# Patient Record
Sex: Male | Born: 1985 | Race: White | Hispanic: No | Marital: Single | Smoking: Current every day smoker
Health system: Southern US, Community
[De-identification: ages and names within clinical notes are randomized; demographics above are authoritative.]

---

## 2006-04-06 ENCOUNTER — Emergency Department: Payer: Self-pay | Admitting: Emergency Medicine

## 2008-07-22 ENCOUNTER — Emergency Department: Payer: Self-pay | Admitting: Emergency Medicine

## 2008-08-06 ENCOUNTER — Emergency Department: Payer: Self-pay | Admitting: Emergency Medicine

## 2008-08-28 ENCOUNTER — Emergency Department: Payer: Self-pay | Admitting: Emergency Medicine

## 2008-12-18 ENCOUNTER — Emergency Department: Payer: Self-pay | Admitting: Emergency Medicine

## 2009-06-22 ENCOUNTER — Emergency Department: Payer: Self-pay | Admitting: Emergency Medicine

## 2012-12-21 ENCOUNTER — Emergency Department: Payer: Self-pay | Admitting: Emergency Medicine

## 2012-12-21 LAB — CBC
HCT: 45.2 % (ref 40.0–52.0)
HGB: 15.7 g/dL (ref 13.0–18.0)
MCH: 30.8 pg (ref 26.0–34.0)
MCV: 89 fL (ref 80–100)
RBC: 5.11 10*6/uL (ref 4.40–5.90)
RDW: 13.8 % (ref 11.5–14.5)
WBC: 10.4 10*3/uL (ref 3.8–10.6)

## 2012-12-28 LAB — WOUND CULTURE

## 2017-08-09 ENCOUNTER — Encounter: Payer: Self-pay | Admitting: *Deleted

## 2017-08-09 ENCOUNTER — Emergency Department
Admission: EM | Admit: 2017-08-09 | Discharge: 2017-08-09 | Discharge status: Against Medical Advice | Payer: Self-pay | Attending: Emergency Medicine | Admitting: Emergency Medicine

## 2017-08-09 DIAGNOSIS — T50901A Poisoning by unspecified drugs, medicaments and biological substances, accidental (unintentional), initial encounter: Secondary | ICD-10-CM | POA: Insufficient documentation

## 2017-08-09 DIAGNOSIS — Z5320 Procedure and treatment not carried out because of patient's decision for unspecified reasons: Secondary | ICD-10-CM | POA: Insufficient documentation

## 2017-08-09 DIAGNOSIS — F439 Reaction to severe stress, unspecified: Secondary | ICD-10-CM

## 2017-08-09 DIAGNOSIS — F17200 Nicotine dependence, unspecified, uncomplicated: Secondary | ICD-10-CM | POA: Insufficient documentation

## 2017-08-09 DIAGNOSIS — F430 Acute stress reaction: Secondary | ICD-10-CM | POA: Insufficient documentation

## 2017-08-09 NOTE — ED Triage Notes (Signed)
[place] where bystanders noticed pt was altered and lethargic, EMS reports slowed resp rate upon their arrival, gave 0.52 mg Narcan PTA and pt became more responsive, upon arrival pt awake and alert, states "this is all over exaggerated" , pt refuses to state if he took medication, states "I just lost a 100,000 dollar job because of this, this is screwed up"

## 2017-08-09 NOTE — ED Provider Notes (Signed)
[facility] Emergency Department Provider Note  ____________________________________________  Time seen: Approximately 10:51 AM  I have reviewed the triage vital signs and the nursing notes.   HISTORY  Chief Complaint Drug Overdose    HPI Caleb Clark is a 31 y.o. male with a history of polysubstance abuse brought by EMS for accidental overdose. The patient admits to taking Xanax and heroin last night and then again this morning. EMS was called for decreased responsiveness; on their arrival, he had significant hypertension with blood pressure of 230s over 110s, normal blood sugar. During his transport, he began to become somnolent and was given 0.5 mg of Narcan with reversal of his somnolence. On arrival to the emergency department, the patient is hypertensive and tachycardic but afebrile. He is requesting to be discharged. The patient denies any pain "I'm fine." He has a GCS is 15, and he denies any SI, HI or hallucinations.   History reviewed. No pertinent past medical history.  There are no active problems to display for this patient.   History reviewed. No pertinent surgical history.    Allergies Patient has no known allergies.  History reviewed. No pertinent family history.  Social History Social History  Substance Use Topics  . Smoking status: Current Every Day Smoker  . Smokeless tobacco: Never Used  . Alcohol use No    Review of Systems Constitutional: No fever/chills. Positive decreased responsiveness. No known injury or trauma. Eyes: No visual changes. ENT: No sore throat. No congestion or rhinorrhea. Cardiovascular: Denies chest pain. Denies palpitations. Respiratory: Denies shortness of breath.  No cough. Gastrointestinal: No abdominal pain.  No nausea, no vomiting.  No diarrhea.  No constipation. Genitourinary: Negative for dysuria. Musculoskeletal: Negative for back pain. Skin: Negative for rash. Neurological: Negative for  headaches. No focal numbness, tingling or weakness.  Psychiatric:positive tearfulness without aside, HI or hallucinations. Positive polysubstance abuse.   ____________________________________________   PHYSICAL EXAM:  VITAL SIGNS: ED Triage Vitals  Enc Vitals Group     BP 08/09/17 1025 (!) 149/100     Pulse Rate 08/09/17 1025 83     Resp 08/09/17 1025 16     Temp 08/09/17 1025 98.7 F (37.1 C)     Temp Source 08/09/17 1025 Oral     SpO2 08/09/17 1025 95 %     Weight 08/09/17 1024 130 lb (59 kg)     Height 08/09/17 1024  (1.727 m)     Head Circumference --      Peak Flow --      Pain Score --      Pain Loc --      Pain Edu? --      Excl. in [place]? --     Constitutional: the patient is alert, oriented and calm. He follows basic commands and answers questions appropriately. He makes good eye contact and has clear speech. Eyes: Conjunctivae are normal.  EOMI.PERRLA. No scleral icterus.o raccoon eyes. Head: Atraumatic.no Battle sign. Nose: No congestion/rhinnorhea.no swelling over the nose or septal hematoma. Mouth/Throat: Mucous membranes are dry.  Neck: No stridor.  Supple.  No JVD. No meningismus. Cardiovascular: Fast rate, regular rhythm. No murmurs, rubs or gallops.  Respiratory: Normal respiratory effort.  No accessory muscle use or retractions. Lungs CTAB.  No wheezes, rales or ronchi. Gastrointestinal: Soft, nontender and nondistended.  No guarding or rebound.  No peritoneal signs. Musculoskeletal: No LE edema. Moves all extremities without pain. Neurologic:  A&Ox3.  Speech is clear.  Face and smile  are symmetric.  EOMI.  Moves all extremities well. Skin:  Skin is warm, dry and intact. No rash noted. Psychiatric: The patient has a depressed mood and is occasionally tearful but denies any SI, HI or hallucinations. He has good insight and normal judgment.  ____________________________________________   LABS (all labs ordered are listed, but only abnormal results are  displayed)  Labs Reviewed - No data to display ____________________________________________  EKG  The patient refuses EKG. ____________________________________________  RADIOLOGY  No results found.  ____________________________________________   PROCEDURES  Procedure(s) performed: None  Procedures  Critical Care performed: No ____________________________________________   INITIAL IMPRESSION / ASSESSMENT AND PLAN / ED COURSE  Pertinent labs & imaging results that were available during my care of the patient were reviewed by me and considered in my medical decision making (see chart for details).  31 y.o. male with a history of polysubstance abuse brought for accidental overdose. At this time, the patient continues to be mildly hypertensive but improved compared to EMS. He has a GCS of 15 and good insight into why he is here. We have had a long discussion about my recommendation for ER observation period for opioid rebound once the Narcan wears off, in addition to additional workup as needed including laboratory studies and imaging if indicated. My examination shows no focal neurologic deficit, nor any psychiatric red flags. The patient understands my recommendations, as well as the risk of leaving including permanent disability or death, and wishes to leave AGAINST MEDICAL ADVICE at this time. I have offered him return any time, and he understands return precautions as well as follow-up instructions.  ____________________________________________  FINAL CLINICAL IMPRESSION(S) / ED DIAGNOSES  Final diagnoses:  Accidental drug overdose, initial encounter  Stress         NEW MEDICATIONS STARTED DURING THIS VISIT:  New Prescriptions   No medications on file      Rockne Menghini, MD 08/09/17 1057

## 2017-08-09 NOTE — Discharge Instructions (Signed)
Please do not use any illicit drugs or prescribed drugs that are not prescribed to you.  Today, your leaving the emergency department AGAINST MEDICAL ADVICE. We have discussed the risk of doing this, including loss of consciousness, fainting, permanent disability, or death.  Return to the emergency department at any time for any symptoms concerning to you.

## 2019-10-28 ENCOUNTER — Emergency Department
Admission: EM | Admit: 2019-10-28 | Discharge: 2019-10-28 | Disposition: A | Discharge status: Home / Self Care | Payer: Medicaid Other | Attending: Emergency Medicine | Admitting: Emergency Medicine

## 2019-10-28 ENCOUNTER — Emergency Department: Payer: Medicaid Other

## 2019-10-28 ENCOUNTER — Encounter: Payer: Self-pay | Admitting: Emergency Medicine

## 2019-10-28 ENCOUNTER — Other Ambulatory Visit: Payer: Self-pay

## 2019-10-28 DIAGNOSIS — Y999 Unspecified external cause status: Secondary | ICD-10-CM | POA: Insufficient documentation

## 2019-10-28 DIAGNOSIS — Y929 Unspecified place or not applicable: Secondary | ICD-10-CM | POA: Diagnosis not present

## 2019-10-28 DIAGNOSIS — Y939 Activity, unspecified: Secondary | ICD-10-CM | POA: Diagnosis not present

## 2019-10-28 DIAGNOSIS — F17200 Nicotine dependence, unspecified, uncomplicated: Secondary | ICD-10-CM | POA: Diagnosis not present

## 2019-10-28 DIAGNOSIS — W2209XA Striking against other stationary object, initial encounter: Secondary | ICD-10-CM | POA: Diagnosis not present

## 2019-10-28 DIAGNOSIS — S6991XA Unspecified injury of right wrist, hand and finger(s), initial encounter: Secondary | ICD-10-CM

## 2019-10-28 DIAGNOSIS — M79641 Pain in right hand: Secondary | ICD-10-CM

## 2019-10-28 MED ORDER — IBUPROFEN 400 MG PO TABS: 400.0000 mg | ORAL_TABLET | Freq: Four times a day (QID) | ORAL | 0 refills | Status: AC | PRN

## 2019-10-28 MED ORDER — ACETAMINOPHEN 325 MG PO TABS
650.0000 mg | ORAL_TABLET | Freq: Once | ORAL | Status: AC
  Administered 2019-10-28: 650 mg via ORAL
  Filled 2019-10-28: qty 2

## 2019-10-28 NOTE — ED Triage Notes (Signed)
Presents with [facility]   States he punched a dresser   Pain and swelling noted to right hand

## 2019-10-28 NOTE — ED Provider Notes (Signed)
[facility] Emergency Department Provider Note  ____________________________________________  Time seen: Approximately 11:13 AM  I have reviewed the triage vital signs and the nursing notes.   HISTORY  Chief Complaint Hand Pain    HPI Caleb Clark is a 33 y.o. male that presents to the emergency department for evaluation of right hand pain after injury today.  Patient punched a dresser.  He is having pain throughout the back of his hand.  He can move all of his fingers.  No numbness, tingling.   History reviewed. No pertinent past medical history.  There are no problems to display for this patient.   History reviewed. No pertinent surgical history.  Prior to Admission medications   Medication Sig Start Date End Date Taking? Authorizing Provider  ibuprofen (ADVIL) 400 MG tablet Take 1 tablet (400 mg total) by mouth every 6 (six) hours as needed. 10/28/19   Laban Emperor, PA-C    Allergies Patient has no known allergies.  No family history on file.  Social History Social History   Tobacco Use  . Smoking status: Current Every Day Smoker  . Smokeless tobacco: Never Used  Substance Use Topics  . Alcohol use: No  . Drug use: Yes     Review of Systems  Respiratory: No SOB. Gastrointestinal: No abdominal pain. Musculoskeletal: Positive for hand pain. Skin: Negative for rash, abrasions, lacerations, ecchymosis. Neurological: Negative for numbness or tingling   ____________________________________________   PHYSICAL EXAM:  VITAL SIGNS: ED Triage Vitals  Enc Vitals Group     BP 10/28/19 1038 (!) 158/98     Pulse Rate 10/28/19 1038 71     Resp 10/28/19 1038 20     Temp 10/28/19 1038 97.7 F (36.5 C)     Temp Source 10/28/19 1038 Oral     SpO2 10/28/19 1038 100 %     Weight 10/28/19 1024 135 lb (61.2 kg)     Height 10/28/19 1024 5\' 8"  (1.727 m)     Head Circumference --      Peak Flow --      Pain Score --      Pain Loc --       Pain Edu? --      Excl. in [place]? --      Constitutional: Alert and oriented. Well appearing and in no acute distress. Eyes: Conjunctivae are normal. PERRL. EOMI. Head: Atraumatic. ENT:      Ears:      Nose: No congestion/rhinnorhea.      Mouth/Throat: Mucous membranes are moist.  Neck: No stridor.  Cardiovascular: Good peripheral circulation.  Symmetric radial pulses bilaterally. Respiratory: Normal respiratory effort without tachypnea or retractions.  Musculoskeletal: Full range of motion to all extremities. No gross deformities appreciated.  Swelling to dorsal right hand.  Full range of motion of fingers. Neurologic:  Normal speech and language. No gross focal neurologic deficits are appreciated.  Skin:  Skin is warm, dry and intact. No rash noted. Psychiatric: Mood and affect are normal. Speech and behavior are normal. Patient exhibits appropriate insight and judgement.   ____________________________________________   LABS (all labs ordered are listed, but only abnormal results are displayed)  Labs Reviewed - No data to display ____________________________________________  EKG   ____________________________________________  RADIOLOGY Robinette Haines, personally viewed and evaluated these images (plain radiographs) as part of my medical decision making, as well as reviewing the written report by the radiologist.  DG Hand Complete Right  Result Date: 10/28/2019 CLINICAL DATA:  Punched a dresser EXAM: RIGHT HAND - COMPLETE 3+ VIEW COMPARISON:  None. FINDINGS: Alignment is anatomic. There is no acute fracture. Joint spaces are preserved. IMPRESSION: No acute fracture or malalignment. Electronically Signed   By: Guadlupe Spanish M.D.   On: 10/28/2019 10:45    ____________________________________________    PROCEDURES  Procedure(s) performed:    Procedures    Medications  acetaminophen (TYLENOL) tablet 650 mg (650 mg Oral Given 10/28/19 1050)      ____________________________________________   INITIAL IMPRESSION / ASSESSMENT AND PLAN / ED COURSE  Pertinent labs & imaging results that were available during my care of the patient were reviewed by me and considered in my medical decision making (see chart for details).  Review of the [place] CSRS was performed in accordance of the [facility] prior to dispensing any controlled drugs.   Patient presented to emergency department for evaluation of hand pain after injury today.  Vital signs and exam are reassuring.  X-ray negative for acute bony abnormalities.  Tylenol was given in the emergency department for pain per patient's request.  Patient will be discharged home with prescriptions for motrin. Patient is to follow up with primary care as directed. Patient is given ED precautions to return to the ED for any worsening or new symptoms.  Caleb Clark was evaluated in Emergency Department on 10/28/2019 for the symptoms described in the history of present illness. He was evaluated in the context of the global COVID-19 pandemic, which necessitated consideration that the patient might be at risk for infection with the SARS-CoV-2 virus that causes COVID-19. Institutional protocols and algorithms that pertain to the evaluation of patients at risk for COVID-19 are in a state of rapid change based on information released by regulatory bodies including the CDC and federal and state organizations. These policies and algorithms were followed during the patient's care in the ED.   ____________________________________________  FINAL CLINICAL IMPRESSION(S) / ED DIAGNOSES  Final diagnoses:  Right hand pain  Hand injury, right, initial encounter      NEW MEDICATIONS STARTED DURING THIS VISIT:  ED Discharge Orders         Ordered    ibuprofen (ADVIL) 400 MG tablet  Every 6 hours PRN     10/28/19 1140              This chart was dictated using voice recognition software/Dragon. Despite  best efforts to proofread, errors can occur which can change the meaning. Any change was purely unintentional.    Enid Derry, PA-C 10/28/19 1441    Dionne Bucy, MD 10/28/19 1447

## 2020-11-18 IMAGING — DX DG HAND COMPLETE 3+V*R*
4 series · 4 of 4 positions shown · non-contrast
Comparison: None.

CLINICAL DATA: Punched a dresser

EXAM:
RIGHT HAND - COMPLETE 3+ VIEW

[hand ap]
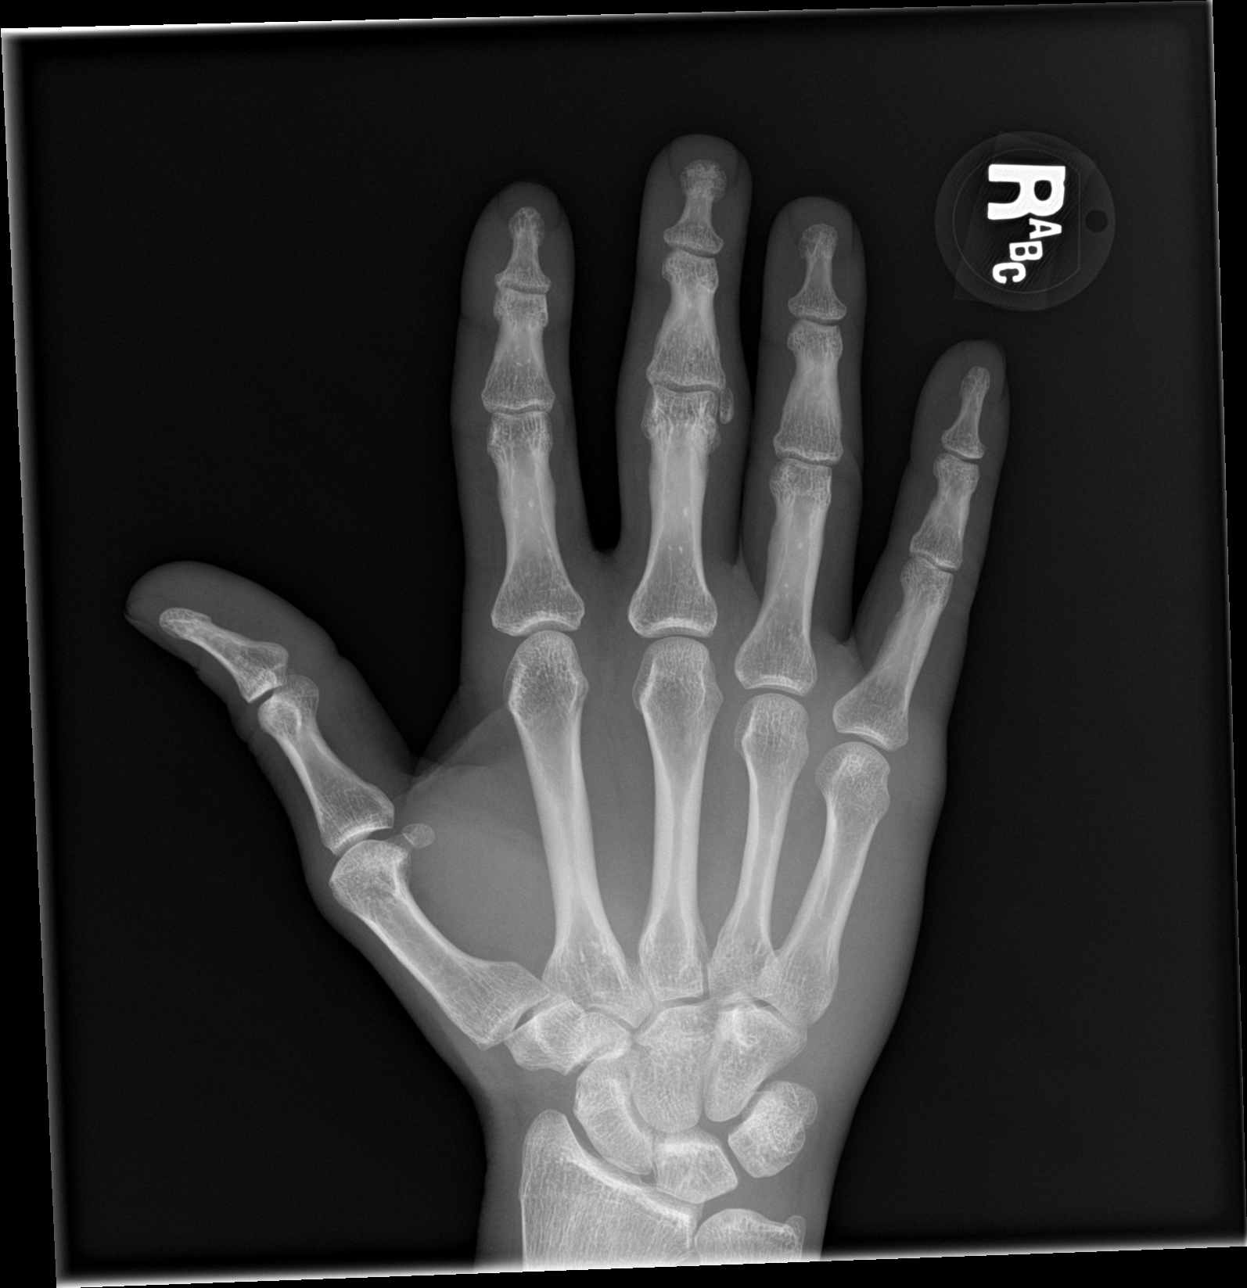

[hand obl (1 of 2)]
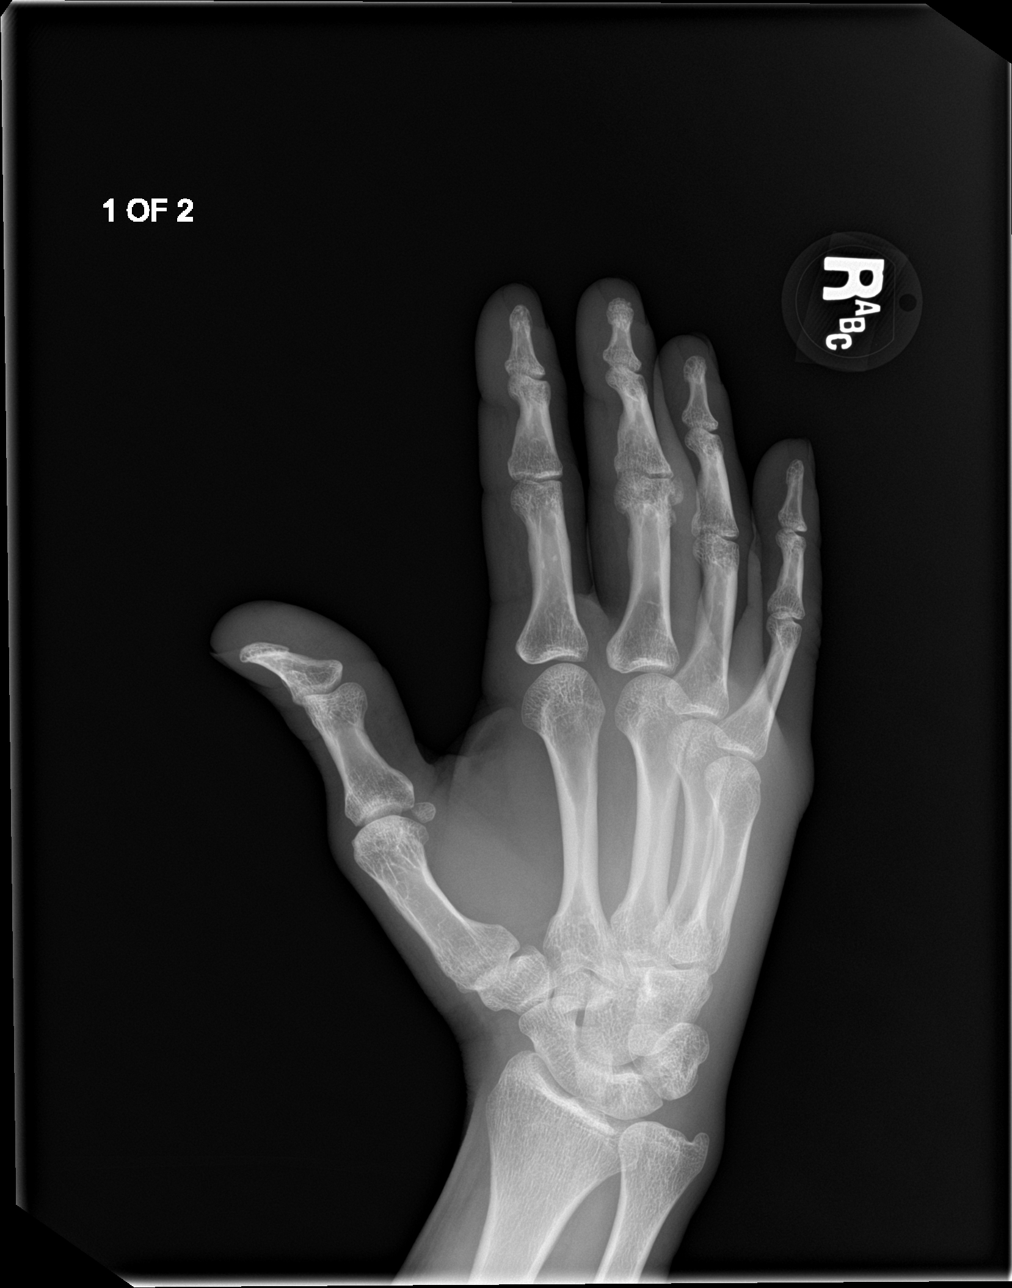

[hand lat]
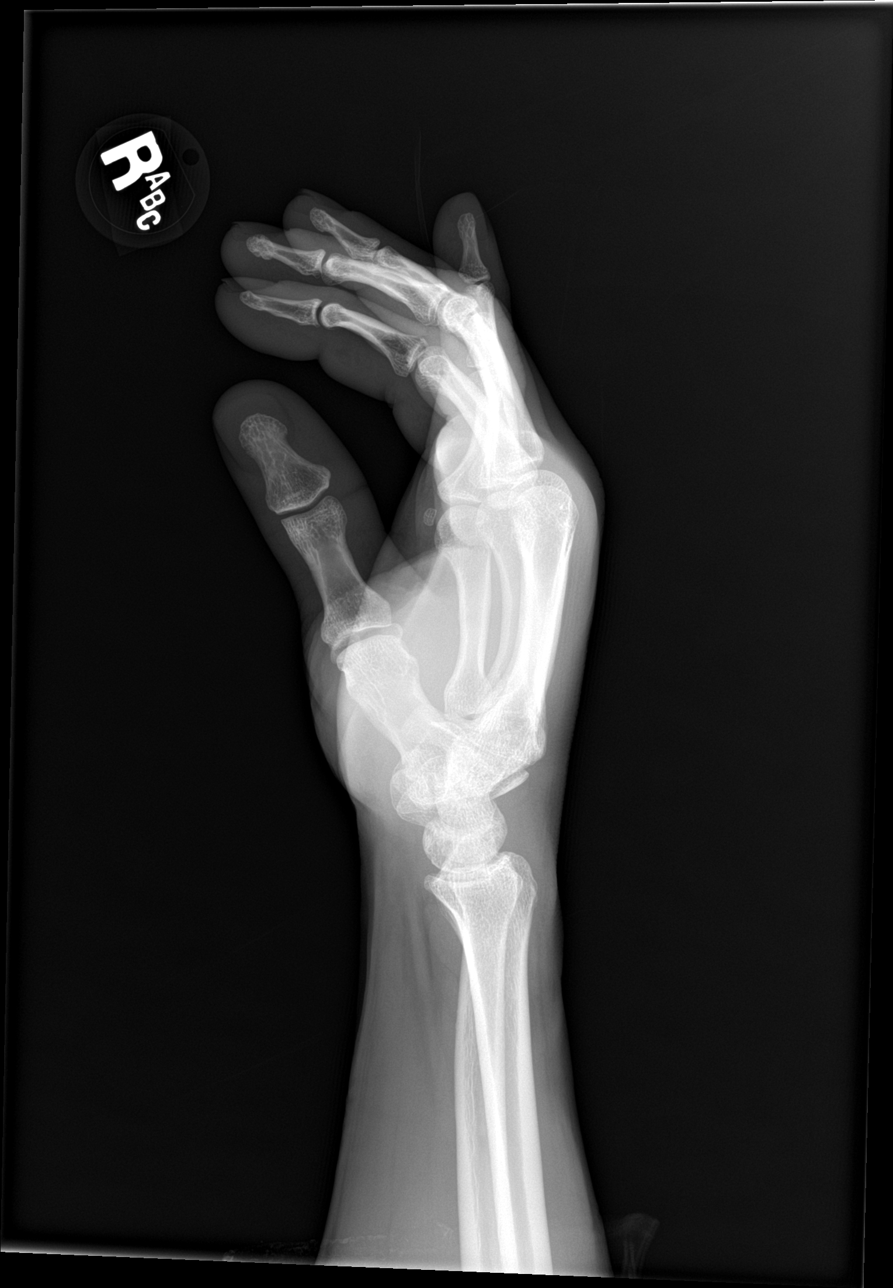

[hand obl (2 of 2)]
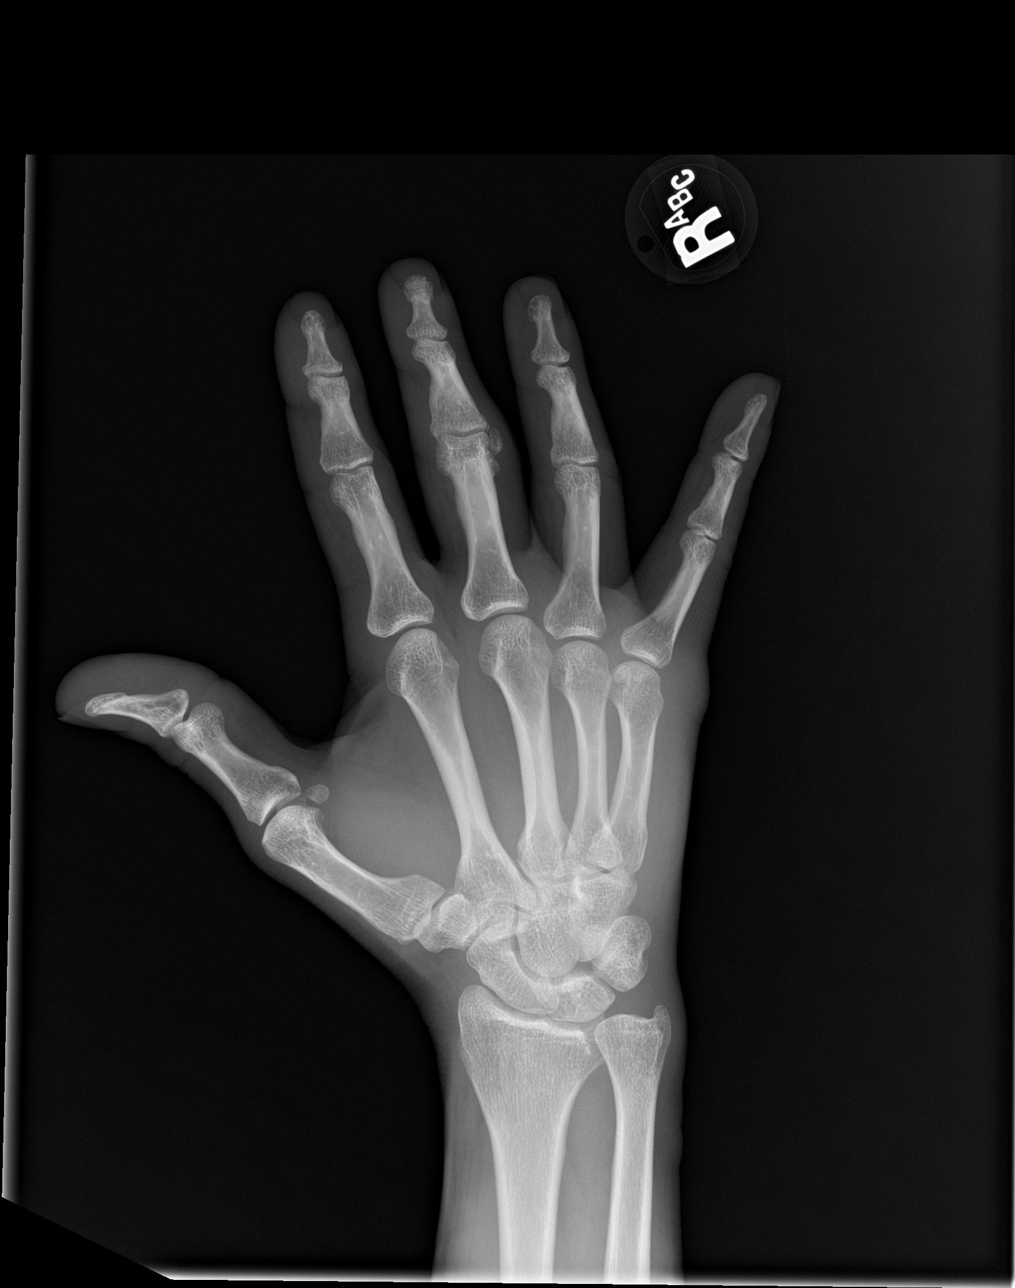

[4 of 4 positions shown; findings below may reference images not displayed]

FINDINGS: Alignment is anatomic. There is no acute fracture. Joint spaces are
preserved.
IMPRESSION: No acute fracture or malalignment.
